# Patient Record
Sex: Male | Born: 1995
Health system: Southern US, Community
[De-identification: ages and names within clinical notes are randomized; demographics above are authoritative.]

---
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
## 2018-12-26 ENCOUNTER — Emergency Department (HOSPITAL_COMMUNITY)
Admission: EM | Admit: 2018-12-26 | Discharge: 2018-12-26 | Discharge status: Against Medical Advice | Payer: Self-pay | Attending: Emergency Medicine | Admitting: Emergency Medicine

## 2018-12-26 ENCOUNTER — Other Ambulatory Visit: Payer: Self-pay

## 2018-12-26 ENCOUNTER — Encounter (HOSPITAL_COMMUNITY): Payer: Self-pay | Admitting: Emergency Medicine

## 2018-12-26 DIAGNOSIS — Z5321 Procedure and treatment not carried out due to patient leaving prior to being seen by health care provider: Secondary | ICD-10-CM | POA: Insufficient documentation

## 2018-12-26 LAB — COMPREHENSIVE METABOLIC PANEL
ALT: 10 U/L (ref 0–44)
AST: 21 U/L (ref 15–41)
Albumin: 4.1 g/dL (ref 3.5–5.0)
Alkaline Phosphatase: 38 U/L (ref 38–126)
Anion gap: 13 (ref 5–15)
BUN: 13 mg/dL (ref 6–20)
CO2: 25 mmol/L (ref 22–32)
Calcium: 9.2 mg/dL (ref 8.9–10.3)
Chloride: 98 mmol/L (ref 98–111)
Creatinine, Ser: 1.18 mg/dL (ref 0.61–1.24)
GFR calc Af Amer: >60 mL/min (ref >60)
GFR calc non Af Amer: >60 mL/min (ref >60)
Glucose, Bld: 93 mg/dL (ref 70–99)
Potassium: 3.7 mmol/L (ref 3.5–5.1)
Sodium: 136 mmol/L (ref 135–145)
Total Bilirubin: 1 mg/dL (ref 0.3–1.2)
Total Protein: 6.8 g/dL (ref 6.5–8.1)

## 2018-12-26 LAB — URINALYSIS, ROUTINE W REFLEX MICROSCOPIC
Bilirubin Urine: NEGATIVE
Glucose, UA: NEGATIVE mg/dL
Hgb urine dipstick: NEGATIVE
Ketones, ur: NEGATIVE mg/dL
Leukocytes,Ua: NEGATIVE
Nitrite: NEGATIVE
Protein, ur: NEGATIVE mg/dL
Specific Gravity, Urine: 1.015 (ref 1.005–1.030)
pH: 7 (ref 5.0–8.0)

## 2018-12-26 LAB — CBC
HCT: 43.2 % (ref 39.0–52.0)
Hemoglobin: 13.7 g/dL (ref 13.0–17.0)
MCH: 23.3 pg — ABNORMAL LOW (ref 26.0–34.0)
MCHC: 31.7 g/dL (ref 30.0–36.0)
MCV: 73.5 fL — ABNORMAL LOW (ref 80.0–100.0)
Platelets: 221 10*3/uL (ref 150–400)
RBC: 5.88 MIL/uL — ABNORMAL HIGH (ref 4.22–5.81)
RDW: 14.9 % (ref 11.5–15.5)
WBC: 5.2 10*3/uL (ref 4.0–10.5)
nRBC: 0 % (ref 0.0–0.2)

## 2018-12-26 LAB — LIPASE, BLOOD: Lipase: 31 U/L (ref 11–51)

## 2018-12-26 MED ORDER — SODIUM CHLORIDE 0.9% FLUSH: 3.0000 mL | Freq: Once | INTRAVENOUS | Status: DC

## 2018-12-26 NOTE — ED Triage Notes (Signed)
Patient with emesis x1, some nausea.  Patient states he was at work when this happened.  Patient denies any abdominal pain, no diarrhea.

## 2019-03-13 ENCOUNTER — Ambulatory Visit (HOSPITAL_COMMUNITY)
Admission: EM | Admit: 2019-03-13 | Discharge: 2019-03-13 | Disposition: A | Discharge status: Home / Self Care | Payer: Self-pay | Attending: Family Medicine | Admitting: Family Medicine

## 2019-03-13 ENCOUNTER — Encounter (HOSPITAL_COMMUNITY): Payer: Self-pay

## 2019-03-13 ENCOUNTER — Other Ambulatory Visit: Payer: Self-pay

## 2019-03-13 DIAGNOSIS — R300 Dysuria: Secondary | ICD-10-CM | POA: Insufficient documentation

## 2019-03-13 MED ORDER — CEFTRIAXONE SODIUM 250 MG IJ SOLR
250.0000 mg | Freq: Once | INTRAMUSCULAR | Status: AC
  Administered 2019-03-13: 250 mg via INTRAMUSCULAR

## 2019-03-13 MED ORDER — AZITHROMYCIN 250 MG PO TABS
ORAL_TABLET | ORAL | Status: AC
  Filled 2019-03-13: qty 4

## 2019-03-13 MED ORDER — CEFTRIAXONE SODIUM 250 MG IJ SOLR
INTRAMUSCULAR | Status: AC
  Filled 2019-03-13: qty 250

## 2019-03-13 MED ORDER — AZITHROMYCIN 250 MG PO TABS
1000.0000 mg | ORAL_TABLET | Freq: Once | ORAL | Status: AC
  Administered 2019-03-13: 1000 mg via ORAL

## 2019-03-13 NOTE — ED Triage Notes (Signed)
Pt states he has penile discharge. Pt states lower abdominal. Pt states this has been going on for 1 week or more.

## 2019-03-13 NOTE — ED Provider Notes (Signed)
[facility]    CSN: [id number] Arrival date & time: 03/13/19  1024      History   Chief Complaint Chief Complaint  Patient presents with  . SEXUALLY TRANSMITTED DISEASE    HPI Jay Camacho is a 23 y.o. male.   Patient is a 23 year old male who presents today with lower abdominal discomfort and dysuria x1 week.  Symptoms have been constant.  Currently sexual active with partner unprotected.  Concern for STD.  Denies any hematuria, penile discharge, rashes or testicle pain. No fever.   ROS per HPI      History reviewed. No pertinent past medical history.  There are no problems to display for this patient.   History reviewed. No pertinent surgical history.     Home Medications    Prior to Admission medications   Not on File    Family History Family History  Problem Relation Age of Onset  . Healthy Mother   . Healthy Father     Social History Social History   Tobacco Use  . Smoking status: Current Every Day Smoker    Types: Cigarettes  Substance Use Topics  . Alcohol use: Yes  . Drug use: Not on file     Allergies   Patient has no known allergies.   Review of Systems Review of Systems   Physical Exam Triage Vital Signs ED Triage Vitals  Enc Vitals Group     BP 03/13/19 1132 120/72     Pulse Rate 03/13/19 1132 73     Resp 03/13/19 1132 18     Temp 03/13/19 1132 98.7 F (37.1 C)     Temp Source 03/13/19 1132 Oral     SpO2 03/13/19 1132 100 %     Weight 03/13/19 1134 147 lb (66.7 kg)     Height --      Head Circumference --      Peak Flow --      Pain Score 03/13/19 1134 6     Pain Loc --      Pain Edu? --      Excl. in GC? --    No data found.  Updated Vital Signs BP 123/71 (BP Location: Right Arm)   Pulse 75   Temp 98.7 F (37.1 C) (Oral)   Resp 17   Wt 147 lb (66.7 kg)   SpO2 100%   BMI 22.35 kg/m   Visual Acuity Right Eye Distance:   Left Eye Distance:   Bilateral Distance:    Right Eye Near:    Left Eye Near:    Bilateral Near:     Physical Exam Vitals and nursing note reviewed.  Constitutional:      Appearance: Normal appearance.  HENT:     Head: Normocephalic and atraumatic.     Nose: Nose normal.  Eyes:     Conjunctiva/sclera: Conjunctivae normal.  Pulmonary:     Effort: Pulmonary effort is normal.  Abdominal:     Palpations: Abdomen is soft.     Tenderness: There is no abdominal tenderness.  Musculoskeletal:        General: Normal range of motion.     Cervical back: Normal range of motion.  Skin:    General: Skin is warm and dry.  Neurological:     Mental Status: He is alert.  Psychiatric:        Mood and Affect: Mood normal.      UC Treatments / Results  Labs (all labs ordered are listed,  but only abnormal results are displayed) Labs Reviewed  CYTOLOGY, (ORAL, ANAL, URETHRAL) ANCILLARY ONLY    EKG   Radiology No results found.  Procedures Procedures (including critical care time)  Medications Ordered in UC Medications  cefTRIAXone (ROCEPHIN) injection 250 mg (has no administration in time range)  azithromycin (ZITHROMAX) tablet 1,000 mg (has no administration in time range)    Initial Impression / Assessment and Plan / UC Course  I have reviewed the triage vital signs and the nursing notes.  Pertinent labs & imaging results that were available during my care of the patient were reviewed by me and considered in my medical decision making (see chart for details).     Dysuria-swab sent for STD testing. Treating prophylactically for gonorrhea chlamydia today. Labs pending  Final Clinical Impressions(s) / UC Diagnoses   Final diagnoses:  Dysuria     Discharge Instructions     Treating for gonorrhea and chlamydia today based on your symptoms.  We will call you with any positive results of your swab    ED Prescriptions    None     PDMP not reviewed this encounter.   Orvan July, NP 03/13/19 1203

## 2019-03-13 NOTE — Discharge Instructions (Signed)
Treating for gonorrhea and chlamydia today based on your symptoms.  We will call you with any positive results of your swab

## 2019-03-14 LAB — CYTOLOGY, (ORAL, ANAL, URETHRAL) ANCILLARY ONLY
Chlamydia: NEGATIVE
Neisseria Gonorrhea: NEGATIVE

## 2019-09-05 ENCOUNTER — Ambulatory Visit: Payer: Self-pay | Admitting: Physician Assistant

## 2019-09-05 ENCOUNTER — Other Ambulatory Visit: Payer: Self-pay

## 2019-09-05 VITALS — BP 111/75 | HR 68 | Temp 98.0°F | Resp 18 | Ht 70.0 in | Wt 148.0 lb

## 2019-09-05 DIAGNOSIS — Z5989 Other problems related to housing and economic circumstances: Secondary | ICD-10-CM

## 2019-09-05 DIAGNOSIS — Z7689 Persons encountering health services in other specified circumstances: Secondary | ICD-10-CM

## 2019-09-05 DIAGNOSIS — Z598 Other problems related to housing and economic circumstances: Secondary | ICD-10-CM

## 2019-09-05 DIAGNOSIS — L309 Dermatitis, unspecified: Secondary | ICD-10-CM

## 2019-09-05 DIAGNOSIS — T304 Corrosion of unspecified body region, unspecified degree: Secondary | ICD-10-CM

## 2019-09-05 DIAGNOSIS — R369 Urethral discharge, unspecified: Secondary | ICD-10-CM

## 2019-09-05 LAB — POCT URINALYSIS DIP (CLINITEK)
Bilirubin, UA: NEGATIVE
Blood, UA: NEGATIVE
Glucose, UA: NEGATIVE mg/dL
Ketones, POC UA: NEGATIVE mg/dL
Leukocytes, UA: NEGATIVE
Nitrite, UA: NEGATIVE
POC PROTEIN,UA: NEGATIVE
Spec Grav, UA: >=1.03 — AB (ref 1.010–1.025)
Urobilinogen, UA: 0.2 E.U./dL
pH, UA: 6.5 (ref 5.0–8.0)

## 2019-09-05 MED ORDER — AZITHROMYCIN 1 G PO PACK: 1.0000 g | PACK | Freq: Once | ORAL | Status: DC

## 2019-09-05 MED ORDER — AZITHROMYCIN 250 MG PO TABS
1000.0000 mg | ORAL_TABLET | Freq: Once | ORAL | Status: AC
  Administered 2019-09-05: 1000 mg via ORAL

## 2019-09-05 MED ORDER — CEFTRIAXONE SODIUM 1 G IJ SOLR
0.5000 g | Freq: Once | INTRAMUSCULAR | Status: AC
  Administered 2019-09-05: 0.5 g via INTRAMUSCULAR

## 2019-09-05 MED ORDER — TRIAMCINOLONE ACETONIDE 0.1 % EX CREA: 1.0000 "application " | TOPICAL_CREAM | Freq: Two times a day (BID) | CUTANEOUS | 0 refills | Status: DC

## 2019-09-05 NOTE — Patient Instructions (Addendum)
We are treating you empirically for gonorrhea and chlamydia, we will call you with your test results, if your test results are positive, it is imperative for follow-up.  I highly recommend that you abstain from sexual intercourse until your test results have returned.  I sent a steroid cream to your pharmacy to help control your eczema, I have added information to this form to help you learn more how to control that.  I encourage you to keep your wound dry, use Neosporin, if you see signs of infection, please feel free to return to the mobile unit for further evaluation.  Please let us know if there is anything else we can do for you  Kennieth Rad, PA-C Physician Assistant [place] [email or web address]    Eczema Eczema is a broad term for a group of skin conditions that cause skin to become rough and inflamed. Each type of eczema has different triggers, symptoms, and treatments. Eczema of any type is usually itchy and symptoms range from mild to severe. Eczema and its symptoms are not spread from person to person (are not contagious). It can appear on different parts of the body at different times. Your eczema may not look the same as someone else's eczema. What are the types of eczema? Atopic dermatitis This is a long-term (chronic) skin disease that keeps coming back (recurring). Usual symptoms are dry skin and small, solid pimples that may swell and leak fluid (weep). Contact dermatitis  This happens when something irritates the skin and causes a rash. The irritation can come from substances that you are allergic to (allergens), such as poison ivy, chemicals, or medicines that were applied to your skin. Dyshidrotic eczema This is a form of eczema on the hands and feet. It shows up as very itchy, fluid-filled blisters. It can affect people of any age, but is more common before age 65. Hand eczema  This causes very itchy areas of  skin on the palms and sides of the hands and fingers. This type of eczema is common in industrial jobs where you may be exposed to many different types of irritants. Lichen simplex chronicus This type of eczema occurs when a person constantly scratches one area of the body. Repeated scratching of the area leads to thickened skin (lichenification). Lichen simplex chronicus can occur along with other types of eczema. It is more common in adults, but may be seen in children as well. Nummular eczema This is a common type of eczema. It has no known cause. It typically causes a red, circular, crusty lesion (plaque) that may be itchy. Scratching may become a habit and can cause bleeding. Nummular eczema occurs most often in people of middle-age or older. It most often affects the hands. Seborrheic dermatitis This is a common skin disease that mainly affects the scalp. It may also affect any oily areas of the body, such as the face, sides of nose, eyebrows, ears, eyelids, and chest. It is marked by small scaling and redness of the skin (erythema). This can affect people of all ages. In infants, this condition is known as Chartered certified accountant." Stasis dermatitis This is a common skin disease that usually appears on the legs and feet. It most often occurs in people who have a condition that prevents blood from being pumped through the veins in the legs (chronic venous insufficiency). Stasis dermatitis is a chronic condition that needs long-term management. How is eczema diagnosed? Your health care provider will examine your skin and review your  medical history. He or she may also give you skin patch tests. These tests involve taking patches that contain possible allergens and placing them on your back. He or she will then check in a few days to see if an allergic reaction occurred. What are the common treatments? Treatment for eczema is based on the type of eczema you have. Hydrocortisone steroid medicine can relieve  itching quickly and help reduce inflammation. This medicine may be prescribed or obtained over-the-counter, depending on the strength of the medicine that is needed. Follow these instructions at home:  Take over-the-counter and prescription medicines only as told by your health care provider.  Use creams or ointments to moisturize your skin. Do not use lotions.  Learn what triggers or irritates your symptoms. Avoid these things.  Treat symptom flare-ups quickly.  Do not itch your skin. This can make your rash worse.  Keep all follow-up visits as told by your health care provider. This is important. Where to find more information  The American Academy of Dermatology: InfoExam.si  The National Eczema Association: www.nationaleczema.org Contact a health care provider if:  You have serious itching, even with treatment.  You regularly scratch your skin until it bleeds.  Your rash looks different than usual.  Your skin is painful, swollen, or more red than usual.  You have a fever. Summary  There are eight general types of eczema. Each type has different triggers.  Eczema of any type causes itching that may range from mild to severe.  Treatment varies based on the type of eczema you have. Hydrocortisone steroid medicine can help with itching and inflammation.  Protecting your skin is the best way to prevent eczema. Use moisturizers and lotions. Avoid triggers and irritants, and treat flare-ups quickly. This information is not intended to replace advice given to you by your health care provider. Make sure you discuss any questions you have with your health care provider. Document Revised: 02/25/2017 Document Reviewed: 07/29/2016 Elsevier Patient Education  2020 [employer].
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
## 2019-09-05 NOTE — Addendum Note (Signed)
Addended by: Margaretmary Lombard on: 09/05/2019 05:40 PM   Modules accepted: Orders

## 2019-09-07 ENCOUNTER — Telehealth: Payer: Self-pay

## 2019-09-07 NOTE — Telephone Encounter (Signed)
Just received a phone call from [facility] stating that they received a specimen in error that should've gone to the hospital. Please contact patient to recollect urine sample.

## 2019-09-10 LAB — HSV TYPE I/II IGG, IGMW/ REFLEX
HSV 1 Glycoprotein G Ab, IgG: <0.91 index (ref 0.00–0.90)
HSV 1 IgM: <1:10 {titer}
HSV 2 IgG, Type Spec: <0.91 index (ref 0.00–0.90)
HSV 2 IgM: <1:10 {titer}

## 2019-09-10 LAB — RPR: RPR Ser Ql: NONREACTIVE

## 2019-09-10 LAB — HIV ANTIBODY (ROUTINE TESTING W REFLEX): HIV Screen 4th Generation wRfx: NONREACTIVE

## 2019-09-11 ENCOUNTER — Telehealth: Payer: Self-pay | Admitting: *Deleted

## 2019-09-11 NOTE — Telephone Encounter (Signed)
Specimen at [facility] is not able to add the STD testing due to specimen being over 20 days old.

## 2019-09-13 ENCOUNTER — Telehealth: Payer: Self-pay | Admitting: *Deleted

## 2019-09-13 NOTE — Telephone Encounter (Signed)
MA unable to reach patient. VM states to return call to [facility] at [phone]

## 2019-09-13 NOTE — Telephone Encounter (Signed)
-----   Message from Roney Jaffe, [place] sent at 09/12/2019 11:34 AM EDT ----- Please call patient and let him know that his labs were negative for herpes virus, syphilis, and HIV.  Unfortunately his urine sample was unable to be tested.  I recommend that he return to the mobile unit for repeat testing 2 weeks after he finishes his treatment that was given to him last week.Thank you

## 2019-10-02 ENCOUNTER — Telehealth: Payer: Self-pay | Admitting: *Deleted

## 2019-10-02 ENCOUNTER — Other Ambulatory Visit: Payer: Self-pay | Admitting: Physician Assistant

## 2019-10-02 DIAGNOSIS — A599 Trichomoniasis, unspecified: Secondary | ICD-10-CM

## 2019-10-02 MED ORDER — METRONIDAZOLE 500 MG PO TABS: 500.0000 mg | ORAL_TABLET | Freq: Two times a day (BID) | ORAL | 0 refills | Status: AC

## 2019-10-02 NOTE — Telephone Encounter (Signed)
Patient verified DOB Patient is aware of HSV, gon, chlamydia, RPR and HIV being negative. Patient is aware of being treated for [facility] and needing to complete the 7 day course and return on 7/27 for a recheck.

## 2019-10-17 ENCOUNTER — Ambulatory Visit (INDEPENDENT_AMBULATORY_CARE_PROVIDER_SITE_OTHER): Payer: Self-pay | Admitting: Internal Medicine

## 2019-10-17 ENCOUNTER — Encounter: Payer: Self-pay | Admitting: Internal Medicine

## 2019-10-17 DIAGNOSIS — M545 Low back pain: Secondary | ICD-10-CM

## 2019-10-17 DIAGNOSIS — Z7689 Persons encountering health services in other specified circumstances: Secondary | ICD-10-CM

## 2019-10-17 DIAGNOSIS — G8929 Other chronic pain: Secondary | ICD-10-CM
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
## 2019-10-18 ENCOUNTER — Encounter: Payer: Self-pay | Admitting: General Practice

## 2019-11-03 ENCOUNTER — Other Ambulatory Visit (HOSPITAL_COMMUNITY)
Admission: RE | Admit: 2019-11-03 | Discharge: 2019-11-03 | Disposition: A | Discharge status: Home / Self Care | Payer: Self-pay | Source: Ambulatory Visit | Attending: Family Medicine | Admitting: Family Medicine

## 2019-11-03 ENCOUNTER — Ambulatory Visit (HOSPITAL_COMMUNITY)
Admission: EM | Admit: 2019-11-03 | Discharge: 2019-11-03 | Disposition: A | Discharge status: Home / Self Care | Payer: Self-pay | Attending: Family Medicine | Admitting: Family Medicine

## 2019-11-03 ENCOUNTER — Other Ambulatory Visit: Payer: Self-pay | Admitting: Family Medicine

## 2019-11-03 ENCOUNTER — Encounter (HOSPITAL_COMMUNITY): Payer: Self-pay | Admitting: Emergency Medicine

## 2019-11-03 ENCOUNTER — Other Ambulatory Visit: Payer: Self-pay

## 2019-11-03 DIAGNOSIS — Z7689 Persons encountering health services in other specified circumstances: Secondary | ICD-10-CM | POA: Insufficient documentation

## 2019-11-03 DIAGNOSIS — R369 Urethral discharge, unspecified: Secondary | ICD-10-CM

## 2019-11-03 MED ORDER — LIDOCAINE HCL (PF) 1 % IJ SOLN
INTRAMUSCULAR | Status: AC
  Filled 2019-11-03: qty 2

## 2019-11-03 MED ORDER — DOXYCYCLINE HYCLATE 100 MG PO TABS: 100.0000 mg | ORAL_TABLET | Freq: Two times a day (BID) | ORAL | 0 refills | Status: AC

## 2019-11-03 MED ORDER — CEFTRIAXONE SODIUM 500 MG IJ SOLR
500.0000 mg | Freq: Once | INTRAMUSCULAR | Status: AC
  Administered 2019-11-03: 500 mg via INTRAMUSCULAR

## 2019-11-03 MED ORDER — CEFTRIAXONE SODIUM 500 MG IJ SOLR
INTRAMUSCULAR | Status: AC
  Filled 2019-11-03: qty 500

## 2019-11-03 NOTE — Discharge Instructions (Signed)
Please take the medicine  We will call with any positive results.  Please follow up if your symptoms fail to improve.

## 2019-11-03 NOTE — ED Triage Notes (Signed)
Penile discharge and pain with urination that started yesterday

## 2019-11-03 NOTE — ED Provider Notes (Signed)
[facility]    CSN: [id number] Arrival date & time: 11/03/19  1614      History   Chief Complaint Chief Complaint  Patient presents with  . Penile Discharge    HPI Jay Camacho is a 24 y.o. male.   He is presenting with discharge from his penis.  Has been present for a few days.  Reports unprotected intercourse.  HPI  Past Medical History:  Diagnosis Date  . Bilateral fracture of mandible (HCC) 10/21/2014    There are no problems to display for this patient.   History reviewed. No pertinent surgical history.     Home Medications    Prior to Admission medications   Medication Sig Start Date End Date Taking? Authorizing Provider  doxycycline (VIBRA-TABS) 100 MG tablet Take 1 tablet (100 mg total) by mouth 2 (two) times daily for 7 days. 11/03/19 11/10/19  Myra Rude, MD    Family History Family History  Problem Relation Age of Onset  . Healthy Mother   . Healthy Father   . Heart failure Maternal Grandmother   . Hypertension Maternal Grandmother     Social History Social History   Tobacco Use  . Smoking status: Current Every Day Smoker    Types: Cigarettes  . Smokeless tobacco: Never Used  Substance Use Topics  . Alcohol use: Yes  . Drug use: Never     Allergies   Cinnamon and Other   Review of Systems Review of Systems  See HPI  Physical Exam Triage Vital Signs ED Triage Vitals  Enc Vitals Group     BP 11/03/19 1711 102/71     Pulse Rate 11/03/19 1711 73     Resp 11/03/19 1711 18     Temp 11/03/19 1711 99.1 F (37.3 C)     Temp Source 11/03/19 1711 Oral     SpO2 11/03/19 1711 98 %     Weight --      Height --      Head Circumference --      Peak Flow --      Pain Score 11/03/19 1710 5     Pain Loc --      Pain Edu? --      Excl. in GC? --    No data found.  Updated Vital Signs BP 102/71 (BP Location: Right Arm)   Pulse 73   Temp 99.1 F (37.3 C) (Oral)   Resp 18   SpO2 98%   Visual Acuity Right  Eye Distance:   Left Eye Distance:   Bilateral Distance:    Right Eye Near:   Left Eye Near:    Bilateral Near:     Physical Exam Gen: NAD, alert, cooperative with exam, well-appearing ENT: normal lips, normal nasal mucosa,  Eye: normal EOM, normal conjunctiva and lids,  GI: no masses or tenderness, no hernia  GU: Discharge appreciated on exam Skin: no rashes, no areas of induration  Neuro: normal tone, normal sensation to touch Psych:  normal insight, alert and oriented    UC Treatments / Results  Labs (all labs ordered are listed, but only abnormal results are displayed) Labs Reviewed - No data to display  EKG   Radiology No results found.  Procedures Procedures (including critical care time)  Medications Ordered in UC Medications  cefTRIAXone (ROCEPHIN) injection 500 mg (500 mg Intramuscular Given 11/03/19 1815)    Initial Impression / Assessment and Plan / UC Course  I have reviewed the triage vital  signs and the nursing notes.  Pertinent labs & imaging results that were available during my care of the patient were reviewed by me and considered in my medical decision making (see chart for details).     Jay Camacho is a 24 year old male is presenting with penile discharge.  Concern for sexual transmitted infection.  Cytology sample was obtained.  Right ceftriaxone and doxycycline.  Counseled on safe sex practices.  Given indications to follow-up.  Final Clinical Impressions(s) / UC Diagnoses   Final diagnoses:  Penile discharge     Discharge Instructions     Please take the medicine  We will call with any positive results.  Please follow up if your symptoms fail to improve.     ED Prescriptions    Medication Sig Dispense Auth. Provider   doxycycline (VIBRA-TABS) 100 MG tablet Take 1 tablet (100 mg total) by mouth 2 (two) times daily for 7 days. 14 tablet Myra Rude, MD     PDMP not reviewed this encounter.   Myra Rude, MD 11/03/19  [phone]

## 2019-11-06 LAB — MOLECULAR ANCILLARY ONLY
Chlamydia: NEGATIVE
Comment: NEGATIVE
Comment: NEGATIVE
Comment: NORMAL
Neisseria Gonorrhea: NEGATIVE
Trichomonas: NEGATIVE

## 2019-11-13 ENCOUNTER — Telehealth: Payer: Self-pay

## 2019-11-13 NOTE — Telephone Encounter (Signed)
Called patient to do their pre-visit COVID screening.  Attempted to contact patient. Received "unable to contact" error multiple times. Unable to do prescreening.

## 2019-11-14 ENCOUNTER — Encounter: Payer: Self-pay | Admitting: Internal Medicine

## 2019-12-30 ENCOUNTER — Ambulatory Visit (HOSPITAL_COMMUNITY)
Admission: EM | Admit: 2019-12-30 | Discharge: 2019-12-30 | Disposition: A | Discharge status: Home / Self Care | Payer: Self-pay

## 2019-12-30 ENCOUNTER — Other Ambulatory Visit: Payer: Self-pay

## 2019-12-30 NOTE — ED Notes (Signed)
Called x3 for room. No answer

## 2019-12-30 NOTE — ED Notes (Signed)
No answer

## 2020-05-27 ENCOUNTER — Ambulatory Visit (HOSPITAL_COMMUNITY)
Admission: EM | Admit: 2020-05-27 | Discharge: 2020-05-27 | Disposition: A | Discharge status: Home / Self Care | Payer: Self-pay | Attending: Student | Admitting: Student

## 2020-05-27 ENCOUNTER — Other Ambulatory Visit: Payer: Self-pay

## 2020-05-27 ENCOUNTER — Encounter (HOSPITAL_COMMUNITY): Payer: Self-pay

## 2020-05-27 DIAGNOSIS — J36 Peritonsillar abscess: Secondary | ICD-10-CM | POA: Insufficient documentation

## 2020-05-27 DIAGNOSIS — J029 Acute pharyngitis, unspecified: Secondary | ICD-10-CM

## 2020-05-27 DIAGNOSIS — N4889 Other specified disorders of penis: Secondary | ICD-10-CM | POA: Insufficient documentation

## 2020-05-27 DIAGNOSIS — R21 Rash and other nonspecific skin eruption: Secondary | ICD-10-CM

## 2020-05-27 LAB — POCT RAPID STREP A, ED / UC: Streptococcus, Group A Screen (Direct): NEGATIVE

## 2020-05-27 NOTE — ED Provider Notes (Addendum)
[facility]    CSN: [id number] Arrival date & time: 05/27/20  0807      History   Chief Complaint Chief Complaint  Patient presents with  . Sore Throat  . Rash    HPI Jay Camacho is a 25 y.o. male presenting for penile irritation and sore throat.  Of multiple visits for the same in the last year.  History urethritis, trichomonas.  -Patient states he has had a sore throat for 4 days.  Negative Covid test on day 3.  Denies other URI symptoms- cough, fevers, nausea/vomiting/diarrhea, congestion.  Denies difficulty breathing.  History of strep in the past. -Also states he noticed some bumps at that top of his penis 3 days ago.  States these are itchy and painful.  Denies new partners, states his only sexual partner is his wife. Denies hematuria, dysuria, frequency, urgency, back pain, n/v/d/abd pain, fevers/chills, abdnormal penile discharge.    HPI  Past Medical History:  Diagnosis Date  . Bilateral fracture of mandible (HCC) 10/21/2014    There are no problems to display for this patient.   History reviewed. No pertinent surgical history.     Home Medications    Prior to Admission medications   Not on File    Family History Family History  Problem Relation Age of Onset  . Healthy Mother   . Healthy Father   . Heart failure Maternal Grandmother   . Hypertension Maternal Grandmother     Social History Social History   Tobacco Use  . Smoking status: Current Every Day Smoker    Types: Cigarettes  . Smokeless tobacco: Never Used  Substance Use Topics  . Alcohol use: Yes  . Drug use: Never     Allergies   Cinnamon and Other   Review of Systems Review of Systems  Constitutional: Negative for appetite change, chills and fever.  HENT: Positive for sore throat. Negative for congestion, ear pain, rhinorrhea, sinus pressure and sinus pain.   Eyes: Negative for pain, redness and visual disturbance.  Respiratory: Negative for cough, chest  tightness, shortness of breath and wheezing.   Cardiovascular: Negative for chest pain and palpitations.  Gastrointestinal: Negative for abdominal pain, constipation, diarrhea, nausea and vomiting.  Genitourinary: Positive for penile pain. Negative for decreased urine volume, difficulty urinating, dysuria, flank pain, frequency, genital sores, hematuria, penile discharge, penile swelling, scrotal swelling, testicular pain and urgency.  Musculoskeletal: Negative for back pain and myalgias.  Skin: Negative for rash.  Neurological: Negative for dizziness, weakness and headaches.  Psychiatric/Behavioral: Negative for confusion.  All other systems reviewed and are negative.    Physical Exam Triage Vital Signs ED Triage Vitals  Enc Vitals Group     BP      Pulse      Resp      Temp      Temp src      SpO2      Weight      Height      Head Circumference      Peak Flow      Pain Score      Pain Loc      Pain Edu?      Excl. in GC?    No data found.  Updated Vital Signs BP 112/73 (BP Location: Right Arm)   Pulse 89   Temp 99.9 F (37.7 C) (Oral)   Resp 17   SpO2 100%   Visual Acuity Right Eye Distance:   Left Eye Distance:  Bilateral Distance:    Right Eye Near:   Left Eye Near:    Bilateral Near:     Physical Exam Vitals reviewed. Exam conducted with a chaperone present.  Constitutional:      General: He is not in acute distress.    Appearance: Normal appearance. He is not ill-appearing.  HENT:     Head: Normocephalic and atraumatic.     Right Ear: Hearing, tympanic membrane, ear canal and external ear normal. No swelling or tenderness. There is no impacted cerumen. No mastoid tenderness. Tympanic membrane is not perforated, erythematous, retracted or bulging.     Left Ear: Hearing, tympanic membrane, ear canal and external ear normal. No swelling or tenderness. There is no impacted cerumen. No mastoid tenderness. Tympanic membrane is not perforated, erythematous,  retracted or bulging.     Nose:     Right Sinus: No maxillary sinus tenderness or frontal sinus tenderness.     Left Sinus: No maxillary sinus tenderness or frontal sinus tenderness.     Mouth/Throat:     Mouth: Mucous membranes are moist.     Pharynx: Uvula midline. Posterior oropharyngeal erythema present. No oropharyngeal exudate.     Tonsils: Tonsillar abscess present. No tonsillar exudate.  Cardiovascular:     Rate and Rhythm: Normal rate and regular rhythm.     Heart sounds: Normal heart sounds.  Pulmonary:     Effort: Pulmonary effort is normal.     Breath sounds: Normal breath sounds and air entry. No wheezing, rhonchi or rales.  Chest:     Chest wall: No tenderness.  Abdominal:     General: Abdomen is flat. Bowel sounds are normal. There is no distension.     Palpations: Abdomen is soft. There is no mass.     Tenderness: There is no abdominal tenderness. There is no right CVA tenderness, left CVA tenderness, guarding or rebound.  Genitourinary:    Penis: Circumcised. Lesions present.      Testes: Normal.        Right: Mass, tenderness, swelling, testicular hydrocele or varicocele not present.        Left: Mass, tenderness, swelling, testicular hydrocele or varicocele not present.     Epididymis:     Right: Normal. No mass or tenderness.     Left: No mass or tenderness.     Comments: Multiple .5cm erythematous bumps at base of penis.  Lymphadenopathy:     Cervical: No cervical adenopathy.  Neurological:     General: No focal deficit present.     Mental Status: He is alert and oriented to person, place, and time.  Psychiatric:        Attention and Perception: Attention and perception normal.        Mood and Affect: Mood and affect normal.        Behavior: Behavior normal. Behavior is cooperative.        Thought Content: Thought content normal.        Judgment: Judgment normal.      UC Treatments / Results  Labs (all labs ordered are listed, but only abnormal  results are displayed) Labs Reviewed  CULTURE, GROUP A STREP [facility])  POCT RAPID STREP A, ED / UC  CYTOLOGY, (ORAL, ANAL, URETHRAL) ANCILLARY ONLY    EKG   Radiology No results found.  Procedures Procedures (including critical care time)  Medications Ordered in UC Medications - No data to display  Initial Impression / Assessment and Plan / UC Course  I have  reviewed the triage vital signs and the nursing notes.  Pertinent labs & imaging results that were available during my care of the patient were reviewed by me and considered in my medical decision making (see chart for details).    This patient is a 25 year old male presenting with penile bumps and peritonsillar abscess.  Will send for G/C, trich, yeast.  Declines HIV, RPR. Will treat based on results.  He is currently  afebrile nontachycardic nontachypneic, oxygenating well on room air. Hemodynamically stable to transport himself in personal vehicle.  Rapid strep negative, culture sent.  I am sending this patient to [facility] ER for further evaluation of peritonsillar abscess.  He understands if he does not go, his airway could become compromised and he could die.  Final Clinical Impressions(s) / UC Diagnoses   Final diagnoses:  Peritonsillar abscess  Penile irritation     Discharge Instructions     -Please head  straight to [facility], ER for further evaluation of peritonsillar abscess. -If you experience dizziness, shortness of breath, chest pain, etc. while on the way to the ER-stop and immediately call 911.     ED Prescriptions    None     PDMP not reviewed this encounter.   Rhys Martini, PA-C 05/27/20 1002    Rhys Martini, PA-C 05/27/20 1004

## 2020-05-27 NOTE — ED Triage Notes (Signed)
Pt presents with rash on groin and penis X 3 days; pt also states he has sore throat X 3 days but had a negative covid test yesterday.

## 2020-05-27 NOTE — ED Notes (Signed)
Patient is being discharged from the Urgent Care and sent to the Emergency Department via POV . Per Laura,PA , patient is in need of higher level of care due to peritonsillar abscess. Patient is aware and verbalizes understanding of plan of care.  Vitals:   05/27/20 0850  BP: 112/73  Pulse: 89  Resp: 17  Temp: 99.9 F (37.7 C)  SpO2: 100%

## 2020-05-27 NOTE — Discharge Instructions (Signed)
-  Please head  straight to [facility], ER for further evaluation of peritonsillar abscess. -If you experience dizziness, shortness of breath, chest pain, etc. while on the way to the ER-stop and immediately call 911.

## 2020-05-28 ENCOUNTER — Encounter (HOSPITAL_COMMUNITY): Payer: Self-pay

## 2020-05-28 ENCOUNTER — Other Ambulatory Visit: Payer: Self-pay

## 2020-05-28 ENCOUNTER — Emergency Department (HOSPITAL_COMMUNITY)
Admission: EM | Admit: 2020-05-28 | Discharge: 2020-05-28 | Disposition: A | Discharge status: Home / Self Care | Payer: Self-pay | Attending: Emergency Medicine | Admitting: Emergency Medicine

## 2020-05-28 ENCOUNTER — Other Ambulatory Visit: Payer: Self-pay | Admitting: Emergency Medicine

## 2020-05-28 DIAGNOSIS — F17210 Nicotine dependence, cigarettes, uncomplicated: Secondary | ICD-10-CM | POA: Insufficient documentation

## 2020-05-28 DIAGNOSIS — J039 Acute tonsillitis, unspecified: Secondary | ICD-10-CM | POA: Insufficient documentation

## 2020-05-28 LAB — CYTOLOGY, (ORAL, ANAL, URETHRAL) ANCILLARY ONLY
Chlamydia: NEGATIVE
Comment: NEGATIVE
Comment: NEGATIVE
Comment: NORMAL
Neisseria Gonorrhea: NEGATIVE
Trichomonas: NEGATIVE

## 2020-05-28 MED ORDER — DEXAMETHASONE SODIUM PHOSPHATE 10 MG/ML IJ SOLN
10.0000 mg | Freq: Once | INTRAMUSCULAR | Status: AC
  Administered 2020-05-28: 10 mg via INTRAVENOUS
  Filled 2020-05-28: qty 1

## 2020-05-28 MED ORDER — CLINDAMYCIN HCL 150 MG PO CAPS: 300.0000 mg | ORAL_CAPSULE | Freq: Three times a day (TID) | ORAL | 0 refills | Status: AC

## 2020-05-28 MED ORDER — CLINDAMYCIN PHOSPHATE 300 MG/50ML IV SOLN
300.0000 mg | Freq: Once | INTRAVENOUS | Status: AC
  Administered 2020-05-28: 300 mg via INTRAVENOUS
  Filled 2020-05-28: qty 50

## 2020-05-28 MED ORDER — LIDOCAINE VISCOUS HCL 2 % MT SOLN: 15.0000 mL | Freq: Four times a day (QID) | OROMUCOSAL | 0 refills | Status: DC | PRN

## 2020-05-28 MED ORDER — LIDOCAINE VISCOUS HCL 2 % MT SOLN
15.0000 mL | Freq: Once | OROMUCOSAL | Status: AC
  Administered 2020-05-28: 15 mL via OROMUCOSAL
  Filled 2020-05-28: qty 15

## 2020-05-28 MED ORDER — SODIUM CHLORIDE 0.9 % IV BOLUS
1000.0000 mL | Freq: Once | INTRAVENOUS | Status: AC
  Administered 2020-05-28: 1000 mL via INTRAVENOUS

## 2020-05-28 NOTE — ED Triage Notes (Addendum)
Pt arrived via walk in, seen at [facility] yesterday for evaluation of sore throat x4 days, told to go to [facility] ED for further evaluation of peritonsillar abcess.

## 2020-05-28 NOTE — Discharge Instructions (Signed)
Take antibiotics as prescribed.  Take the entire course, even if symptoms improve. Make sure you are staying well-hydrated water. Take ibuprofen 3 times a day with meals.  Do not take other anti-inflammatories at the same time (Advil, Motrin, naproxen, Aleve). You may supplement with Tylenol if you need further pain control. Use the liquid numbing medicine as needed for pain.  Follow-up with either your primary care doctor or the ear nose and throat doctor as needed for further evaluation. Return to emergency room if you develop inability to open up your mouth, inability to swallow your own spit, difficulty breathing, any new, worsening, or concerning symptoms.
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
## 2020-05-29 ENCOUNTER — Emergency Department (HOSPITAL_COMMUNITY): Payer: Self-pay

## 2020-05-29 ENCOUNTER — Encounter (HOSPITAL_COMMUNITY): Payer: Self-pay

## 2020-05-29 ENCOUNTER — Other Ambulatory Visit: Payer: Self-pay

## 2020-05-29 ENCOUNTER — Telehealth: Payer: Self-pay | Admitting: *Deleted

## 2020-05-29 ENCOUNTER — Emergency Department (HOSPITAL_COMMUNITY)
Admission: EM | Admit: 2020-05-29 | Discharge: 2020-05-29 | Disposition: A | Discharge status: Home / Self Care | Payer: Self-pay | Attending: Emergency Medicine | Admitting: Emergency Medicine

## 2020-05-29 DIAGNOSIS — Y9355 Activity, bike riding: Secondary | ICD-10-CM | POA: Insufficient documentation

## 2020-05-29 DIAGNOSIS — F17210 Nicotine dependence, cigarettes, uncomplicated: Secondary | ICD-10-CM | POA: Insufficient documentation

## 2020-05-29 DIAGNOSIS — S80212A Abrasion, left knee, initial encounter: Secondary | ICD-10-CM | POA: Insufficient documentation

## 2020-05-29 DIAGNOSIS — Y92410 Unspecified street and highway as the place of occurrence of the external cause: Secondary | ICD-10-CM | POA: Insufficient documentation

## 2020-05-29 DIAGNOSIS — S60511A Abrasion of right hand, initial encounter: Secondary | ICD-10-CM | POA: Insufficient documentation

## 2020-05-29 DIAGNOSIS — S60512A Abrasion of left hand, initial encounter: Secondary | ICD-10-CM | POA: Insufficient documentation

## 2020-05-29 DIAGNOSIS — T07XXXA Unspecified multiple injuries, initial encounter: Secondary | ICD-10-CM

## 2020-05-29 DIAGNOSIS — Z23 Encounter for immunization: Secondary | ICD-10-CM | POA: Insufficient documentation

## 2020-05-29 DIAGNOSIS — S93602A Unspecified sprain of left foot, initial encounter: Secondary | ICD-10-CM | POA: Insufficient documentation

## 2020-05-29 DIAGNOSIS — S80211A Abrasion, right knee, initial encounter: Secondary | ICD-10-CM | POA: Insufficient documentation

## 2020-05-29 LAB — CULTURE, GROUP A STREP (THRC): Special Requests: NORMAL

## 2020-05-29 MED ORDER — IBUPROFEN 800 MG PO TABS
800.0000 mg | ORAL_TABLET | Freq: Once | ORAL | Status: AC
  Administered 2020-05-29: 800 mg via ORAL
  Filled 2020-05-29: qty 1

## 2020-05-29 MED ORDER — TETANUS-DIPHTH-ACELL PERTUSSIS 5-2.5-18.5 LF-MCG/0.5 IM SUSY
0.5000 mL | PREFILLED_SYRINGE | Freq: Once | INTRAMUSCULAR | Status: AC
  Administered 2020-05-29: 0.5 mL via INTRAMUSCULAR
  Filled 2020-05-29: qty 0.5

## 2020-05-29 MED FILL — LIDOCAINE 2% VISCOUS SOLN: 2 | 25 days supply | Qty: 100 | Fill #0

## 2020-05-29 MED FILL — CLINDAMYCIN HCL 300 MG CAPS: 300 | 7 days supply | Qty: 42 | Fill #0

## 2020-05-29 NOTE — Telephone Encounter (Signed)
Transition Care Management Follow-up Telephone Call  Date of discharge and from where: 05/28/2020 - [facility]  How have you been since you were released from the hospital? "Feeling better"  Any questions or concerns? No  Items Reviewed:  Did the pt receive and understand the discharge instructions provided? Yes   Medications obtained and verified? Yes   Other? No   Any new allergies since your discharge? No   Dietary orders reviewed? Yes  Do you have support at home? Yes   Home Care and Equipment/Supplies: Were home health services ordered? not applicable If so, what is the name of the agency? N/A  Has the agency set up a time to come to the patient's home? not applicable Were any new equipment or medical supplies ordered?  No What is the name of the medical supply agency? N/A Were you able to get the supplies/equipment? not applicable Do you have any questions related to the use of the equipment or supplies? No  Functional Questionnaire: (I = Independent and D = Dependent) ADLs: I  Bathing/Dressing- I  Meal Prep- I  Eating- I  Maintaining continence- I  Transferring/Ambulation- I  Managing Meds- I  Follow up appointments reviewed:   PCP Hospital f/u appt confirmed? No    Specialist Hospital f/u appt confirmed? No    Are transportation arrangements needed? No   If their condition worsens, is the pt aware to call PCP or go to the Emergency Dept.? Yes  Was the patient provided with contact information for the PCP's office or ED? Yes  Was to pt encouraged to call back with questions or concerns? Yes
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
## 2020-05-30 ENCOUNTER — Telehealth: Payer: Self-pay

## 2020-05-30 NOTE — Telephone Encounter (Signed)
Transition Care Management Follow-up Telephone Call  Date of discharge and from where: 05/29/2020 from [facility]  How have you been since you were released from the hospital? Pt states that is feeling about the same. Pt has no questions or concerns at this time. Pt encouraged to call PCP for follow up appt and evaluation of injuries.   Any questions or concerns? No  Items Reviewed:  Did the pt receive and understand the discharge instructions provided? Yes   Medications obtained and verified? Yes   Other? No   Any new allergies since your discharge? No   Dietary orders reviewed? n/a  Do you have support at home? Yes    Functional Questionnaire: (I = Independent and D = Dependent) ADLs: I  Bathing/Dressing- I  Meal Prep- I  Eating- I  Maintaining continence- I  Transferring/Ambulation- I  Managing Meds- I   Follow up appointments reviewed:   PCP Hospital f/u appt confirmed? No  Pt encouraged to call PCP office for a follow up appt.   Specialist Hospital f/u appt confirmed? No    Are transportation arrangements needed? No   If their condition worsens, is the pt aware to call PCP or go to the Emergency Dept.? Yes  Was the patient provided with contact information for the PCP's office or ED? Yes  Was to pt encouraged to call back with questions or concerns? Yes

## 2021-09-10 ENCOUNTER — Encounter (HOSPITAL_COMMUNITY): Payer: Self-pay | Admitting: Emergency Medicine

## 2021-09-10 ENCOUNTER — Ambulatory Visit (HOSPITAL_COMMUNITY)
Admission: EM | Admit: 2021-09-10 | Discharge: 2021-09-10 | Disposition: A | Discharge status: Home / Self Care | Payer: Self-pay | Attending: Urgent Care | Admitting: Urgent Care

## 2021-09-10 DIAGNOSIS — R369 Urethral discharge, unspecified: Secondary | ICD-10-CM

## 2021-09-10 DIAGNOSIS — Z202 Contact with and (suspected) exposure to infections with a predominantly sexual mode of transmission: Secondary | ICD-10-CM

## 2021-09-10 MED ORDER — DOXYCYCLINE HYCLATE 100 MG PO CAPS: 100.0000 mg | ORAL_CAPSULE | Freq: Two times a day (BID) | ORAL | 0 refills | Status: AC

## 2021-09-10 NOTE — Discharge Instructions (Addendum)
You were tested today for gonorrhea, chlamydia, trichomonas. Due to your known exposure, we will start treatment for chlamydia.  We will call you with the results of your test once received and start any additional treatments if necessary.  Please avoid all forms of intercourse until test results received, and if positive for any STI, all partners will need to complete entire course of antibiotics prior to resuming. As always, practice safer sexual practices by using protection each and every time, and limiting number of partners.   Start taking the antibiotic twice daily, do not stop taking it just because you feel better. Monitor for any adverse reactions. Do not take it within two hours of milk consumption, and avoid multivitamins while on the antibiotic. Please avoid excessive sun exposure or tanning beds while taking. Drink plenty of water.

## 2021-09-10 NOTE — ED Triage Notes (Signed)
Patient c/o STD exposure.   Patient endorses being exposed to Chlamydia.   Patient endorses penile drainage.   Patient denies pain or dysuria.

## 2021-09-10 NOTE — ED Provider Notes (Signed)
[facility]    CSN: [id number] Arrival date & time: 09/10/21  1734      History   Chief Complaint Chief Complaint  Patient presents with   Exposure to STD    HPI Jay Camacho is a 26 y.o. male.   Pt reports being told two days ago that he was exposed to chlamydia. Yesterday, pt admits to developing penile discharge. He denies rash, fever, flank pain, tesicular pain, swelling or any additional symptoms. Denies dysuria or frequency. No abx use for > 30 days.   Exposure to STD This is a new problem. The current episode started 2 days ago.    Past Medical History:  Diagnosis Date   Bilateral fracture of mandible (HCC) 10/21/2014    There are no problems to display for this patient.   History reviewed. No pertinent surgical history.     Home Medications    Prior to Admission medications   Medication Sig Start Date End Date Taking? Authorizing Provider  doxycycline (VIBRAMYCIN) 100 MG capsule Take 1 capsule (100 mg total) by mouth 2 (two) times daily for 7 days. 09/10/21 09/17/21 Yes Dreux Mcgroarty, Jodelle Gross, PA    Family History Family History  Problem Relation Age of Onset   Healthy Mother    Healthy Father    Heart failure Maternal Grandmother    Hypertension Maternal Grandmother     Social History Social History   Tobacco Use   Smoking status: Every Day    Types: Cigarettes   Smokeless tobacco: Never  Substance Use Topics   Alcohol use: Yes   Drug use: Never     Allergies   Cinnamon and Other   Review of Systems Review of Systems Penile discharge All other sx negative  Physical Exam Triage Vital Signs ED Triage Vitals  Enc Vitals Group     BP 09/10/21 1830 106/70     Pulse Rate 09/10/21 1830 70     Resp 09/10/21 1830 12     Temp 09/10/21 1830 98.9 F (37.2 C)     Temp Source 09/10/21 1830 Oral     SpO2 09/10/21 1830 95 %     Weight --      Height --      Head Circumference --      Peak Flow --      Pain Score 09/10/21  1831 0     Pain Loc --      Pain Edu? --      Excl. in GC? --    No data found.  Updated Vital Signs BP 106/70 (BP Location: Right Arm)   Pulse 70   Temp 98.9 F (37.2 C) (Oral)   Resp 12   SpO2 95%   Visual Acuity Right Eye Distance:   Left Eye Distance:   Bilateral Distance:    Right Eye Near:   Left Eye Near:    Bilateral Near:     Physical Exam Vitals and nursing note reviewed.  Constitutional:      General: He is not in acute distress.    Appearance: Normal appearance. He is well-developed and normal weight. He is not ill-appearing.  HENT:     Head: Normocephalic and atraumatic.  Eyes:     Conjunctiva/sclera: Conjunctivae normal.  Cardiovascular:     Rate and Rhythm: Normal rate.  Pulmonary:     Effort: Pulmonary effort is normal. No respiratory distress.     Breath sounds: Normal breath sounds.  Abdominal:  General: There is no distension.     Palpations: Abdomen is soft.     Tenderness: There is no abdominal tenderness. There is no right CVA tenderness or left CVA tenderness.  Genitourinary:    Comments: GU exam deferred Musculoskeletal:        General: No swelling.     Cervical back: Neck supple.  Skin:    General: Skin is warm and dry.     Capillary Refill: Capillary refill takes less than 2 seconds.  Neurological:     Mental Status: He is alert.  Psychiatric:        Mood and Affect: Mood normal.      UC Treatments / Results  Labs (all labs ordered are listed, but only abnormal results are displayed) Labs Reviewed  CYTOLOGY, (ORAL, ANAL, URETHRAL) ANCILLARY ONLY    EKG   Radiology No results found.  Procedures Procedures (including critical care time)  Medications Ordered in UC Medications - No data to display  Initial Impression / Assessment and Plan / UC Course  I have reviewed the triage vital signs and the nursing notes.  Pertinent labs & imaging results that were available during my care of the patient were reviewed by  me and considered in my medical decision making (see chart for details).     Urethritis - given known exposure to chlamydia, will start empiric tx. Pt advised to avoid intercourse until all partners treated. Urethral swab collected. Doxy BID x 7 days.   Final Clinical Impressions(s) / UC Diagnoses   Final diagnoses:  Penile discharge  Exposure to chlamydia     Discharge Instructions      You were tested today for gonorrhea, chlamydia, trichomonas. Due to your known exposure, we will start treatment for chlamydia.  We will call you with the results of your test once received and start any additional treatments if necessary.  Please avoid all forms of intercourse until test results received, and if positive for any STI, all partners will need to complete entire course of antibiotics prior to resuming. As always, practice safer sexual practices by using protection each and every time, and limiting number of partners.   Start taking the antibiotic twice daily, do not stop taking it just because you feel better. Monitor for any adverse reactions. Do not take it within two hours of milk consumption, and avoid multivitamins while on the antibiotic. Please avoid excessive sun exposure or tanning beds while taking. Drink plenty of water.       ED Prescriptions     Medication Sig Dispense Auth. Provider   doxycycline (VIBRAMYCIN) 100 MG capsule Take 1 capsule (100 mg total) by mouth 2 (two) times daily for 7 days. 14 capsule Atiyana Welte L, PA      PDMP not reviewed this encounter.   Maretta Bees, [place] 09/11/21 0006

## 2021-09-11 LAB — CYTOLOGY, (ORAL, ANAL, URETHRAL) ANCILLARY ONLY
Chlamydia: POSITIVE — AB
Comment: NEGATIVE
Comment: NEGATIVE
Comment: NORMAL
Neisseria Gonorrhea: NEGATIVE
Trichomonas: NEGATIVE
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
## 2022-07-14 ENCOUNTER — Encounter: Payer: Self-pay | Admitting: *Deleted

## 2023-02-04 ENCOUNTER — Ambulatory Visit
Admission: EM | Admit: 2023-02-04 | Discharge: 2023-02-04 | Disposition: A | Discharge status: Home / Self Care | Payer: MEDICAID | Attending: Internal Medicine | Admitting: Internal Medicine

## 2023-02-04 DIAGNOSIS — Z113 Encounter for screening for infections with a predominantly sexual mode of transmission: Secondary | ICD-10-CM | POA: Diagnosis present

## 2023-02-04 DIAGNOSIS — N342 Other urethritis: Secondary | ICD-10-CM | POA: Diagnosis present

## 2023-02-04 MED ORDER — DOXYCYCLINE HYCLATE 100 MG PO CAPS: 100.0000 mg | ORAL_CAPSULE | Freq: Two times a day (BID) | ORAL | 0 refills | Status: DC

## 2023-02-04 MED ORDER — CEFTRIAXONE SODIUM 500 MG IJ SOLR
500.0000 mg | INTRAMUSCULAR | Status: DC
Start: 2023-02-04 — End: 2023-02-04
  Administered 2023-02-04: 500 mg via INTRAMUSCULAR

## 2023-02-04 NOTE — ED Provider Notes (Signed)
[facility]  Note:  This document was prepared using Conservation officer, historic buildings and may include unintentional dictation errors.  MRN: [id number] DOB: 12-30-1995  Subjective:   Jay Camacho is a 27 y.o. male presenting for 1 week history of penile discharge, dysuria.  Patient does have unprotected sex.  Would like complete STI testing.  He does have a history of sexually transmitted infection, last episode on file was June 2023.  No current facility-administered medications for this encounter. No current outpatient medications on file.   Allergies  Allergen Reactions   Cinnamon Anaphylaxis   Other Anaphylaxis    Hot sauce swells tongue and throat up Hot sauce swells tongue and throat up     Past Medical History:  Diagnosis Date   Bilateral fracture of mandible (HCC) 10/21/2014     History reviewed. No pertinent surgical history.  Family History  Problem Relation Age of Onset   Healthy Mother    Healthy Father    Heart failure Maternal Grandmother    Hypertension Maternal Grandmother     Social History   Tobacco Use   Smoking status: Every Day    Types: Cigarettes   Smokeless tobacco: Never  Vaping Use   Vaping status: Never Used  Substance Use Topics   Alcohol use: Yes    Comment: occ   Drug use: Never    ROS   Objective:   Vitals: BP 115/70 (BP Location: Right Arm)   Pulse 75   Temp 98.1 F (36.7 C) (Oral)   Resp 16   SpO2 96%   Physical Exam Constitutional:      General: He is not in acute distress.    Appearance: Normal appearance. He is well-developed and normal weight. He is not ill-appearing, toxic-appearing or diaphoretic.  HENT:     Head: Normocephalic and atraumatic.     Right Ear: External ear normal.     Left Ear: External ear normal.     Nose: Nose normal.     Mouth/Throat:     Pharynx: Oropharynx is clear.  Eyes:     General: No scleral icterus.       Right eye: No discharge.        Left  eye: No discharge.     Extraocular Movements: Extraocular movements intact.  Cardiovascular:     Rate and Rhythm: Normal rate.  Pulmonary:     Effort: Pulmonary effort is normal.  Genitourinary:    Penis: Circumcised. Discharge present. No phimosis, paraphimosis, hypospadias, erythema, tenderness, swelling or lesions.   Musculoskeletal:     Cervical back: Normal range of motion.  Neurological:     Mental Status: He is alert and oriented to person, place, and time.  Psychiatric:        Mood and Affect: Mood normal.        Behavior: Behavior normal.        Thought Content: Thought content normal.        Judgment: Judgment normal.    IM ceftriaxone 500 mg administered in clinic.  Assessment and Plan :   PDMP not reviewed this encounter.  1. Urethritis   2. Screen for STD (sexually transmitted disease)    Patient treated empirically as per CDC guidelines with IM ceftriaxone, doxycycline as an outpatient.  Labs pending.   Counseled on safe sex practices including abstaining for 1 week following treatment.  Counseled patient on potential for adverse effects with medications prescribed/recommended today, ER and return-to-clinic precautions discussed,  patient verbalized understanding.    Wallis Bamberg, [place] 02/04/23 [phone]

## 2023-02-04 NOTE — Discharge Instructions (Addendum)
Avoid all forms of sexual intercourse (oral, vaginal, anal) for the next 7 days to avoid spreading/reinfecting or at least until we can see what kinds of infection results are positive.  Abstaining for 2 weeks would be better but at least 1 week is required.  We will let you know about your test results from the swab we did today and if you need any prescriptions for antibiotics or changes to your treatment from today.  You can retest for any infections you test positive for starting December 03/13/2023.

## 2023-02-04 NOTE — ED Triage Notes (Signed)
Pt c/o dysuria and penile d/c x 1 week-NAD-steady gait

## 2023-02-05 LAB — HIV ANTIBODY (ROUTINE TESTING W REFLEX): HIV Screen 4th Generation wRfx: NONREACTIVE

## 2023-02-05 LAB — RPR: RPR Ser Ql: NONREACTIVE

## 2023-02-07 ENCOUNTER — Telehealth (HOSPITAL_BASED_OUTPATIENT_CLINIC_OR_DEPARTMENT_OTHER): Payer: Self-pay

## 2023-02-07 LAB — CYTOLOGY, (ORAL, ANAL, URETHRAL) ANCILLARY ONLY
Chlamydia: NEGATIVE
Comment: NEGATIVE
Comment: NEGATIVE
Comment: NORMAL
Neisseria Gonorrhea: NEGATIVE
Trichomonas: POSITIVE — AB

## 2023-02-07 MED ORDER — METRONIDAZOLE 500 MG PO TABS: 2000.0000 mg | ORAL_TABLET | Freq: Once | ORAL | 0 refills | Status: AC

## 2023-02-07 NOTE — Telephone Encounter (Signed)
Contacted patient by phone.  Verified identity using two identifiers.  Provided positive result.  Reviewed safe sex practices, notifying partners, and refraining from sexual activities for 7 days from time of treatment.  Patient verified understanding, all questions answered.     Per protocol, pt requires tx with metronidazole. Reviewed with patient, verified pharmacy, prescription sent.

## 2024-03-02 ENCOUNTER — Ambulatory Visit (HOSPITAL_COMMUNITY)
Admission: EM | Admit: 2024-03-02 | Discharge: 2024-03-02 | Disposition: A | Discharge status: Home / Self Care | Payer: MEDICAID | Attending: Emergency Medicine | Admitting: Emergency Medicine

## 2024-03-02 ENCOUNTER — Encounter (HOSPITAL_COMMUNITY): Payer: Self-pay | Admitting: *Deleted

## 2024-03-02 DIAGNOSIS — Z113 Encounter for screening for infections with a predominantly sexual mode of transmission: Secondary | ICD-10-CM

## 2024-03-02 LAB — HIV ANTIBODY (ROUTINE TESTING W REFLEX): HIV Screen 4th Generation wRfx: NONREACTIVE

## 2024-03-02 NOTE — ED Triage Notes (Signed)
 Pt states he would like annual STI testing denies any sx, he would like cyto and blood work.

## 2024-03-02 NOTE — Discharge Instructions (Signed)
 Your results will come back over the next few days and someone will call if results are positive and require treatment.  Return here as needed.

## 2024-03-02 NOTE — ED Provider Notes (Signed)
 [facility]    CSN: [id number] Arrival date & time: 03/02/24  1121      History   Chief Complaint Chief Complaint  Patient presents with   SEXUALLY TRANSMITTED DISEASE    HPI Jay Camacho is a 28 y.o. male.   Patient presents for routine STD testing.  Patient denies any known exposures to STDs.  Patient also denies any concerning symptoms.  The history is provided by the patient and medical records.    Past Medical History:  Diagnosis Date   Bilateral fracture of mandible (HCC) 10/21/2014    There are no active problems to display for this patient.   History reviewed. No pertinent surgical history.     Home Medications    Prior to Admission medications   Not on File    Family History Family History  Problem Relation Age of Onset   Healthy Mother    Healthy Father    Heart failure Maternal Grandmother    Hypertension Maternal Grandmother     Social History Social History   Tobacco Use   Smoking status: Every Day    Types: Cigarettes   Smokeless tobacco: Never  Vaping Use   Vaping status: Never Used  Substance Use Topics   Alcohol use: Yes    Comment: occ   Drug use: Never     Allergies   Cinnamon and Other   Review of Systems Review of Systems  Per HPI  Physical Exam Triage Vital Signs ED Triage Vitals  Encounter Vitals Group     BP 03/02/24 1151 101/63     Girls Systolic BP Percentile --      Girls Diastolic BP Percentile --      Boys Systolic BP Percentile --      Boys Diastolic BP Percentile --      Pulse Rate 03/02/24 1151 66     Resp 03/02/24 1151 14     Temp 03/02/24 1151 98.3 F (36.8 C)     Temp Source 03/02/24 1151 Oral     SpO2 03/02/24 1151 98 %     Weight --      Height --      Head Circumference --      Peak Flow --      Pain Score 03/02/24 1150 0     Pain Loc --      Pain Education --      Exclude from Growth Chart --    No data found.  Updated Vital Signs BP 101/63 (BP Location:  Left Arm)   Pulse 66   Temp 98.3 F (36.8 C) (Oral)   Resp 14   SpO2 98%   Visual Acuity Right Eye Distance:   Left Eye Distance:   Bilateral Distance:    Right Eye Near:   Left Eye Near:    Bilateral Near:     Physical Exam Vitals and nursing note reviewed.  Constitutional:      General: He is awake. He is not in acute distress.    Appearance: Normal appearance. He is well-developed and well-groomed. He is not ill-appearing.  Genitourinary:    Comments: Exam deferred Neurological:     General: No focal deficit present.     Mental Status: He is alert and oriented to person, place, and time. Mental status is at baseline.  Psychiatric:        Behavior: Behavior is cooperative.      UC Treatments / Results  Labs (all labs ordered are  listed, but only abnormal results are displayed) Labs Reviewed  HIV ANTIBODY (ROUTINE TESTING W REFLEX)  SYPHILIS: RPR W/REFLEX TO RPR TITER AND TREPONEMAL ANTIBODIES, TRADITIONAL SCREENING AND DIAGNOSIS ALGORITHM  CYTOLOGY, (ORAL, ANAL, URETHRAL) ANCILLARY ONLY    EKG   Radiology No results found.  Procedures Procedures (including critical care time)  Medications Ordered in UC Medications - No data to display  Initial Impression / Assessment and Plan / UC Course  I have reviewed the triage vital signs and the nursing notes.  Pertinent labs & imaging results that were available during my care of the patient were reviewed by me and considered in my medical decision making (see chart for details).     Patient is overall well-appearing.  Vitals are stable.  GU exam deferred.  Patient performed self swab for STD.  HIV and RPR ordered.  Discussed follow-up and return precautions. Final Clinical Impressions(s) / UC Diagnoses   Final diagnoses:  Screen for STD (sexually transmitted disease)     Discharge Instructions      Your results will come back over the next few days and someone will call if results are positive and  require treatment.  Return here as needed.    ED Prescriptions   None    PDMP not reviewed this encounter.   Johnie Flaming A, NP 03/02/24 1201

## 2024-03-03 LAB — SYPHILIS: RPR W/REFLEX TO RPR TITER AND TREPONEMAL ANTIBODIES, TRADITIONAL SCREENING AND DIAGNOSIS ALGORITHM: RPR Ser Ql: NONREACTIVE

## 2024-03-05 LAB — CYTOLOGY, (ORAL, ANAL, URETHRAL) ANCILLARY ONLY
Chlamydia: NEGATIVE
Comment: NEGATIVE
Comment: NEGATIVE
Comment: NORMAL
Neisseria Gonorrhea: NEGATIVE
Trichomonas: NEGATIVE
